# Patient Record
Sex: Female | Born: 1970 | Hispanic: Yes | Marital: Single | Smoking: Current some day smoker
Health system: Southern US, Community
[De-identification: ages and names within clinical notes are randomized; demographics above are authoritative.]

## PROBLEM LIST (undated history)

## (undated) DIAGNOSIS — IMO0002 Reserved for concepts with insufficient information to code with codable children: Secondary | ICD-10-CM

## (undated) DIAGNOSIS — N920 Excessive and frequent menstruation with regular cycle: Secondary | ICD-10-CM

## (undated) HISTORY — PX: SKIN GRAFT: SHX250

## (undated) HISTORY — DX: Reserved for concepts with insufficient information to code with codable children: IMO0002

## (undated) HISTORY — PX: DILATION AND CURETTAGE OF UTERUS: SHX78

## (undated) HISTORY — DX: Excessive and frequent menstruation with regular cycle: N92.0

---

## 2011-07-27 ENCOUNTER — Encounter: Payer: Self-pay | Admitting: Advanced Practice Midwife

## 2011-07-29 ENCOUNTER — Encounter: Payer: Self-pay | Admitting: Obstetrics & Gynecology

## 2011-07-29 ENCOUNTER — Ambulatory Visit (INDEPENDENT_AMBULATORY_CARE_PROVIDER_SITE_OTHER): Payer: Self-pay | Admitting: Obstetrics & Gynecology

## 2011-07-29 VITALS — BP 115/77 | HR 76 | Temp 98.2°F | Ht 63.5 in | Wt 155.3 lb

## 2011-07-29 DIAGNOSIS — N920 Excessive and frequent menstruation with regular cycle: Secondary | ICD-10-CM

## 2011-07-29 NOTE — Patient Instructions (Signed)
IMPORTANTE: CMO USAR ESTA INFORMACIN:  Este es un resumen y NO contiene toda la informacin disponible de este producto. Esta informacin no garantiza que este producto sea seguro, eficaz o apropiado para usted. Esta informacin no es una consulta mdica individualizada y no pretende sustituir la opinin mdica de su profesional sanitario. Siempre consulte a su profesional sanitario para obtener informacin ms completa sobre este producto y sus necesidades sanitarias especficas.    LEVONORGESTREL, IMPLANTE - INTRAUTERINO  USOS:  Este producto es un dispositivo pequeo y flexible que se coloca en la matriz (tero) para prevenir el embarazo. Se usa en mujeres que desean un mtodo anticonceptivo reversible a largo plazo (hasta 5 aos). El dispositivo funciona liberando lentamente una hormona (levonorgestrel) similar a cierta substancia generada por el cuerpo de la mujer.  Este producto slo est dirigido a mujeres que ya han tenido hijos y que slo tienen una pareja sexual. No es para mujeres con antecedentes de ciertas infecciones/afecciones (por ejemplo, enfermedad inflamatoria plvica, enfermedades de transmisin sexual, cierto problema de embarazo conocido como embarazo ectpico). Consulte a su mdico para obtener ms informacin.  El uso de este dispositivo mdico no la protege a usted ni a su compaero sexual contra las enfermedades de transmisin sexual (por ejemplo, VIH, gonorrea).  Lea atentamente toda la informacin que le proporciona su mdico y pregunte cualquier duda que tenga sobre este producto o sobre otros mtodos anticonceptivos que puedan ser adecuados para usted.    MODO DE EMPLEO:  Lea el Folleto de Informacin para el Paciente que su farmacutico le facilita antes de introducirse este dispositivo mdico y cada vez que lo vuelva a insertar. El folleto contiene informacin muy importante acerca de los efectos secundarios y acerca de cundo es importante consultar a su mdico. Si tiene  preguntas, consulte a su mdico o farmacutico.  Un profesional sanitario con la adecuada capacitacin le insertar este producto en el tero, generalmente una vez cada 5 aos o segn lo determine su mdico. El medicamento que se encuentra dentro de este dispositivo se liberar lentamente a su organismo a lo largo de 5 aos.  Haga una cita de seguimiento de 4 a 12 semanas despus de la insercin de este producto para comprobar que sigue correctamente colocado.  Si despus de esos 5 aos todava desea seguir usando este mtodo anticonceptivo, se le extraer el dispositivo viejo y se insertar uno nuevo. El dispositivo mdico puede extraerse en cualquier momento por un profesional de la salud con la capacitacin apropiada.  Aprenda todas las instrucciones sobre cmo y cundo revisar este producto, su posicin correcta en su cuerpo, as como asegurar que entiende los riesgos asociados a su uso. Consulte tambin la seccin de Precauciones.    EFECTOS SECUNDARIOS:  Puede causar hemorragia vaginal irregular (por ejemplo, manchas), calambres, dolor de cabeza, nuseas, dolor de senos, acn, erupciones en la piel, cada de pelo, aumento de peso o prdida del inters sexual. Informe de inmediato a su mdico si cualquiera de estos sntomas persiste o empeora.  Recuerde que su mdico le ha recetado este dispositivo mdico porque ha determinado que el beneficio para usted es mayor que el riesgo de sufrir los efectos secundarios. Mucha gente que lo utiliza no presenta efectos secundarios graves.  Notifique de inmediato a su mdico si presenta cualquiera de los siguientes graves efectos secundarios: ausencia del perodo menstrual, fiebre sin explicacin aparente, escalofros, dificultad para respirar, cambios mentales/anmicos (por ejemplo, depresin, nerviosismo), hinchazn/picazn vaginal, relacin sexual dolorosa.  Notifique de inmediato a su mdico   si presenta cualquiera de los siguientes efectos secundarios poco  probables pero graves: dolor de cabeza intenso (migraa), vmitos, cansancio, ritmo cardaco acelerado/fuerte.  Notifique de inmediato a su mdico si presenta cualquiera de los siguientes efectos secundarios muy poco comunes pero muy graves: sangrado vaginal prolongado o abundante, secreciones/olores vaginales inusuales, llagas vaginales, dolor o sensibilizacin del abdomen/pelvis, bultos en los senos, ojos/piel amarillentos, orina oscura, nuseas persistentes, dificultad para orinar.  Es raro que se produzca una reaccin alrgica muy grave a este medicamento. Sin embargo, busque atencin mdica de urgencia si nota cualquier sntoma de una reaccin alrgica grave, incluyendo: erupciones de la piel, picazn/inflamacin (especialmente en cara/lengua/garganta), mareos intensos, dificultad para respirar.  Esta no es una lista completa de los posibles efectos secundarios. Comunquese con su mdico o farmacutico si nota otros efectos no mencionados anteriormente.  En los [place] - Llame a su mdico para consultarlo acerca de los efectos secundarios. Puede reportar efectos secundarios a la FDA al 1-800-FDA-1088.  En [place] - Llame a su mdico para consultarlo acerca de los efectos secundarios. Puede reportar efectos secundarios a [facility] [place] al [phone].    PRECAUCIONES:  Antes de usar este dispositivo mdico, informe a su mdico o farmacutico si es alrgico al levonorgestrel, a cualquier otro progestgeno (por ejemplo, noretindrona, desogestrel), o si padece de cualquier otra alergia. Este producto podra contener ingredientes inactivos que pueden causar reacciones alrgicas u otros problemas. Consulte a su farmacutico para obtener ms informacin.  Este dispositivo mdico no debe usarse si padece ciertas afecciones mdicas. Antes de usarlo, consulte a su mdico o farmacutico en caso de presentar: embarazo o sospecha de embarazo, embarazo ectpico previo, problemas uterinos (por  ejemplo, cncer, endometriosis, miomas, enfermedad inflamatoria plvica, o PID, por sus siglas en ingls), otro DIU (dispositivo intrauterino) todava puesto, problemas vaginales (por ejemplo, infeccin), cncer de mama, enfermedad/tumores hepticos, cualquier afeccin que afecte su sistema inmune (por ejemplo, SIDA, leucemia).  Antes de usar este producto, informe a su mdico acerca de su historial mdico, especialmente acerca de: trastornos hemorrgicos (por ejemplo, cambios menstruales, problemas de coagulacin sangunea), problemas cardacos (por ejemplo, afecciones vasculares congnitas), presin arterial alta, dolores de cabeza por migraa, derrame cerebral, diabetes.  Si padece de diabetes, este medicamento puede hacer ms difcil el control de sus niveles de azcar en sangre. Tome lectura de su azcar en sangre regularmente, segn las indicaciones de su mdico. Comparta los resultados con su mdico y reporte cualquier sntoma, como aumento de la sed/micciones. Es posible que tenga que modificar su dieta o sus medicamentos antidiabticos.  Este dispositivo mdico puede ocasionalmente salirse o cambiar de lugar. Esto puede resultar en embarazos no deseados u otros problemas. Despus de cada perodo menstrual, asegrese de que sigue en su lugar. Consulte con su mdico para aprender a revisar su dispositivo. Si se sale o no puede encontrar sus hilos, comunquese con su mdico lo antes posible y use un mtodo anticonceptivo de respaldo, como condones.  Si usted o su pareja sexual tiene otras parejas sexuales, este dispositivo mdico podra ya no ser una buena opcin para prevenir el embarazo. Si usted o su pareja se vuelven positivos al VIH, o piensa que pudo haber estado expuesto a cualquier enfermedad de transmisin sexual, comunquese inmediatamente con su mdico. Debe considerar quitarse este dispositivo.  Este dispositivo mdico no debe usarse durante el embarazo. Si queda embarazada o cree que puede  estarlo, informe de inmediato a su mdico. Si acaba de dar a luz y no est amamantando, o si   perdi un embarazo o abort despus del primer trimestre, espere por lo menos 6 semanas antes de volver a usar este dispositivo mdico. Consulte con su mdico acerca de problemas que pueden ocurrir durante el embarazo mientras usa este dispositivo.  El levonorgestrel pasa a la leche materna. Consulte a su mdico antes de amamantar.    INTERACCIONES CON OTROS MEDICAMENTOS:  Es posible que su mdico o farmacutico ya tenga conocimiento de cualquier posible interaccin con otros medicamentos y lo est supervisando para detectarla. No empiece, suspenda ni cambie la dosificacin de ningn medicamento sin antes consultar a su mdico o farmacutico.  Antes de usar este dispositivo mdico, informe a su mdico acerca de todos los productos de venta con y sin receta mdica que est utilizando, especialmente: anticoagulantes (por ejemplo, warfarina), mtodos anticonceptivos tomados por va oral o aplicados en la piel (parche), cierto frmaco usado para el tratamiento de venas varicosas (tetradecil sulfato de sodio), frmacos que afectan su sistema inmune (por ejemplo, corticosteroides como prednisona).  Este documento no menciona todas las posibles interacciones.  Por lo tanto, antes de usar este producto, informe a su mdico o farmacutico de todos los productos que est usando. Lleve consigo una lista de todos sus medicamentos y comprtala con su mdico y su farmacutico.    SOBREDOSIS:  Es muy poco probable que ocurra una sobredosis con este medicamento debido al uso de este dispositivo para administrar el frmaco. Consulte a su mdico o farmacutico para obtener informacin adicional.    NOTAS:  No comparta este medicamento con nadie.  Asista a todas sus citas mdicas y de laboratorio.  Regularmente debe someterse a exmenes fsicos completos que incluyan medicin de la presin arterial, exploracin de las mamas,  exmenes plvicos, prueba de [place]apan[place] (para detectar cncer de cuello uterino). Siga las instrucciones de su mdico para examinarse sus propios senos y reporte inmediatamente la aparicin de cualquier bulto. Consulte a su mdico para obtener ms detalles.    DOSIS OMITIDA:  No aplica.    CONSERVACIN:  Antes de usarlo, mantenga este producto a una temperatura ambiente de 25 grados C (77 grados F), protegido de la luz y la humedad. Por un perodo breve puede guardarlo entre 15 y 30 grados C (59 a 86 grados F). Mantenga todos los medicamentos y los dispositivos mdicos fuera del alcance de los nios y las mascotas.  No deseche medicamentos en el inodoro ni en el desage a menos que se lo indiquen. Deseche apropiadamente este producto cuando se cumpla la fecha de caducidad o cuando ya no lo necesite.  Consulte a su farmacutico o a su compaa local de eliminacin de desechos para obtener mayor informacin sobre cmo desechar de forma segura su producto.    ltima actualizacin junio, 2010 Copyright(c) 2010 [employer]      

## 2011-07-29 NOTE — Progress Notes (Signed)
  Subjective:    Patient ID: Sharon Perry, female    DOB: [birth year], 40 y.o.   MRN: [id number]  HPI [id number] Patient's last menstrual period was 07/22/2011. Pt has heavy periods and uses condoms for [facility]. Considering ablation but counseled at length re need for permanent [facility], risk of  Sterilization and ablation including failure and cost, and anesthesia, She will schedule Mirena insertion, will sign papers for [employer] Past Medical History  Diagnosis Date  . Menorrhagia   . Cyst     not sure which type per pt, was diagnosed with vaginal ultrasound   Past Surgical History  Procedure Date  . Dilation and curettage of uterus    Allergies  Allergen Reactions  . Bactrim Hives  . Sulfa Antibiotics Hives and Other (See Comments)    Causes fever   Current outpatient prescriptions:b complex vitamins tablet, Take 1 tablet by mouth daily.  , Disp: , Rfl: ;  ibuprofen (ADVIL,MOTRIN) 600 MG tablet, Take 600 mg by mouth every 6 (six) hours as needed.  , Disp: , Rfl:  History   Social History  . Marital Status: Single    Spouse Name: N/A    Number of Children: N/A  . Years of Education: N/A   Occupational History  . Not on file.   Social History Main Topics  . Smoking status: Current Some Day Smoker -- 0.1 packs/day  . Smokeless tobacco: Not on file  . Alcohol Use: 1.2 oz/week    1 Cans of beer, 1 Glasses of wine per week  . Drug Use: No  . Sexually Active: Yes    Birth Control/ Protection: Condom   Other Topics Concern  . Not on file   Social History Narrative  . No narrative on file   Married  Review of Systems As in HPI    Objective:   Physical Exam  NAD, pleasant  Pelvic deferred    Assessment & Plan:  Menorrhagia Schedule Mirena

## 2011-10-14 ENCOUNTER — Encounter: Payer: Self-pay | Admitting: Obstetrics and Gynecology

## 2011-10-14 ENCOUNTER — Ambulatory Visit (INDEPENDENT_AMBULATORY_CARE_PROVIDER_SITE_OTHER): Payer: Self-pay | Admitting: Obstetrics and Gynecology

## 2011-10-14 VITALS — BP 128/84 | HR 67 | Temp 97.3°F | Wt 160.5 lb

## 2011-10-14 DIAGNOSIS — Z30430 Encounter for insertion of intrauterine contraceptive device: Secondary | ICD-10-CM

## 2011-10-14 DIAGNOSIS — Z01812 Encounter for preprocedural laboratory examination: Secondary | ICD-10-CM

## 2011-10-14 LAB — POCT PREGNANCY, URINE: Preg Test, Ur: NEGATIVE

## 2011-10-14 MED ORDER — NAPROXEN SODIUM 550 MG PO TABS: 550.0000 mg | ORAL_TABLET | Freq: Two times a day (BID) | ORAL | Status: AC

## 2011-10-14 NOTE — Progress Notes (Signed)
S: Pt presents today for Mirena IUD insertion. She states she is doing well and has no complaints at this time. O: VSS A&Ox3 in NAD NL external genitalia Speculum exam performed. Cervix easily visualized. Cervix cleaned with betadine. Tenaculum then placed on posterior lip of the cervix. Uterus sounded to 8cm. Mirena IUD insert was then placed without difficulty. Pt tolerated this procedure well. A/P: IUD insert: discussed diet, activity, risks, and precautions. Will give Rx for anaprox ds. She will f/u in 2 months. Discussed diet, activity, risks, and precautions.  Clinton Gallant. Rice III, DrHSc, MPAS, PA-C

## 2011-10-19 MED ORDER — IBUPROFEN 200 MG PO TABS
800.0000 mg | ORAL_TABLET | Freq: Once | ORAL | Status: AC
  Administered 2011-10-19: 800 mg via ORAL

## 2011-10-19 NOTE — Progress Notes (Signed)
Addended by: Gerome Apley on: 10/19/2011 04:49 PM   Modules accepted: Orders

## 2011-12-23 ENCOUNTER — Encounter: Payer: Self-pay | Admitting: Family

## 2011-12-23 ENCOUNTER — Ambulatory Visit (INDEPENDENT_AMBULATORY_CARE_PROVIDER_SITE_OTHER): Payer: Self-pay | Admitting: Family

## 2011-12-23 VITALS — BP 128/82 | HR 77 | Temp 97.1°F | Ht 65.0 in | Wt 166.9 lb

## 2011-12-23 DIAGNOSIS — N926 Irregular menstruation, unspecified: Secondary | ICD-10-CM

## 2011-12-23 DIAGNOSIS — N920 Excessive and frequent menstruation with regular cycle: Secondary | ICD-10-CM | POA: Insufficient documentation

## 2011-12-23 LAB — POCT PREGNANCY, URINE: Preg Test, Ur: NEGATIVE

## 2011-12-23 NOTE — Progress Notes (Signed)
  Subjective:    Patient ID: Sharon Perry, female    DOB: 1971-07-01, 41 y.o.   MRN: [id number]  HPI Pt here for IUD check after insertion 2 months ago for menorrhagia.  Reports decrease in bleeding.  No questions or concerns.     Review of Systems    None per pt Objective:   Physical Exam  Constitutional: She is oriented to person, place, and time. She appears well-developed and well-nourished.  HENT:  Head: Normocephalic.  Neck: Normal range of motion. Neck supple.  Abdominal: Soft. There is no tenderness.  Genitourinary: Cervix exhibits no motion tenderness, no discharge and no friability. Vaginal discharge (white) found.       iud strings seen at normal length  Neurological: She is alert and oriented to person, place, and time.  Skin: Skin is warm and dry.          Assessment & Plan:  IUD string check  Plan: Provided reassurance F/U prn [facility]

## 2014-08-25 ENCOUNTER — Encounter: Payer: Self-pay | Admitting: Family

## 2016-06-17 ENCOUNTER — Encounter: Payer: Self-pay | Admitting: *Deleted

## 2016-06-20 ENCOUNTER — Encounter: Payer: Self-pay | Admitting: Family Medicine

## 2016-06-22 ENCOUNTER — Encounter: Payer: Self-pay | Admitting: Family Medicine

## 2016-07-07 ENCOUNTER — Ambulatory Visit: Payer: Self-pay | Admitting: Family Medicine

## 2016-07-15 ENCOUNTER — Ambulatory Visit: Payer: Self-pay | Admitting: Family Medicine

## 2016-07-29 ENCOUNTER — Ambulatory Visit: Payer: Self-pay | Admitting: Family Medicine

## 2016-07-29 VITALS — BP 135/84 | HR 85

## 2016-07-29 DIAGNOSIS — Z975 Presence of (intrauterine) contraceptive device: Secondary | ICD-10-CM

## 2016-07-29 NOTE — Progress Notes (Signed)
Patient not seen by provider

## 2016-07-29 NOTE — Progress Notes (Signed)
Video interpreter [phone] Pt here today for removal of IUD.  Pt informed me that she does not have insurance but that she did pay money to the front office. I advised pt that she could go the [facility] to have IUD removed and also reinserted for possibly free or cost less.  Pt stated that she was informed that she needed to come here to get it removed.  [facility] [place] [facility] and [facility] an [facility]t.  Also gave pt number for the Free Pap Screening and also [facility].  Pt stated thank you.

## 2016-09-07 ENCOUNTER — Other Ambulatory Visit: Payer: Self-pay | Admitting: Obstetrics and Gynecology

## 2016-09-07 DIAGNOSIS — Z1231 Encounter for screening mammogram for malignant neoplasm of breast: Secondary | ICD-10-CM

## 2016-09-27 ENCOUNTER — Ambulatory Visit (HOSPITAL_COMMUNITY)
Admission: RE | Admit: 2016-09-27 | Discharge: 2016-09-27 | Disposition: A | Discharge status: Home / Self Care | Payer: Self-pay | Source: Ambulatory Visit | Attending: Obstetrics and Gynecology | Admitting: Obstetrics and Gynecology

## 2016-09-27 ENCOUNTER — Encounter (HOSPITAL_COMMUNITY): Payer: Self-pay | Admitting: *Deleted

## 2016-09-27 ENCOUNTER — Ambulatory Visit
Admission: RE | Admit: 2016-09-27 | Discharge: 2016-09-27 | Disposition: A | Discharge status: Home / Self Care | Payer: No Typology Code available for payment source | Source: Ambulatory Visit | Attending: Obstetrics and Gynecology | Admitting: Obstetrics and Gynecology

## 2016-09-27 VITALS — BP 118/80 | Temp 98.7°F | Ht 64.5 in | Wt 162.8 lb

## 2016-09-27 DIAGNOSIS — Z1231 Encounter for screening mammogram for malignant neoplasm of breast: Secondary | ICD-10-CM

## 2016-09-27 DIAGNOSIS — Z1239 Encounter for other screening for malignant neoplasm of breast: Secondary | ICD-10-CM

## 2016-09-27 NOTE — Progress Notes (Signed)
No complaints today.   Pap Smear:  Pap smear not completed today. Last Pap smear was 04/12/2016 at [facility] and normal with negative HPV. Per patient has no history of an abnormal Pap smear. Last Pap smear result is scanned into EPIC under media.  Physical exam: Breasts Breasts symmetrical. No skin abnormalities bilateral breasts. No nipple retraction bilateral breasts. No nipple discharge bilateral breasts. No lymphadenopathy. No lumps palpated bilateral breasts. No complaints of pain or tenderness on exam. [facility] mamm[facility]. Appointment scheduled for Tuesday, September 27, 2016 at 1120.        Pelvic/Bimanual No Pap smear completed today since last Pap smear and HPV typing was completed 04/12/2016. Pap smear not indicated per BCCCP guidelines.   Smoking History: Patient has never smoked.  Patient Navigation: Patient education provided. [facility]. Spanish interpreter provided.  Used Spanish interpreter [employer] Gal[employer] from [facility] [employer].

## 2016-09-27 NOTE — Patient Instructions (Addendum)
Explained breast self awareness with Sharon HahnMarjorie Perry. Patient did not need a Pap smear today due to last Pap smear and HPV typing was 12/14/2015. Let her know BCCCP will cover Pap smears and HPV typing every 5 years unless has a history of abnormal Pap smears. [facility]. Appointment scheduled for Tuesday, September 27, 2016 at 1120. Let patient know the [facility] Center will follow up with her within the next couple weeks with results of mammogram by letter or phone. Sharon HahnMarjorie Perry verbalized understanding.  Cooper Stamp, Kathaleen Maserhristine Poll, RN 12:57 PM

## 2016-09-30 ENCOUNTER — Encounter (HOSPITAL_COMMUNITY): Payer: Self-pay | Admitting: *Deleted

## 2021-08-23 ENCOUNTER — Other Ambulatory Visit: Payer: Self-pay | Admitting: Nurse Practitioner

## 2021-08-23 DIAGNOSIS — Z1231 Encounter for screening mammogram for malignant neoplasm of breast: Secondary | ICD-10-CM

## 2021-09-02 ENCOUNTER — Other Ambulatory Visit: Payer: Self-pay

## 2021-09-02 ENCOUNTER — Ambulatory Visit
Admission: RE | Admit: 2021-09-02 | Discharge: 2021-09-02 | Disposition: A | Discharge status: Home / Self Care | Payer: No Typology Code available for payment source | Source: Ambulatory Visit | Attending: Nurse Practitioner | Admitting: Nurse Practitioner

## 2021-09-02 ENCOUNTER — Other Ambulatory Visit: Payer: Self-pay | Admitting: Nurse Practitioner

## 2021-09-02 DIAGNOSIS — Z1231 Encounter for screening mammogram for malignant neoplasm of breast: Secondary | ICD-10-CM

## 2022-11-29 IMAGING — MG MM DIGITAL SCREENING BILAT W/ TOMO AND CAD
8 series · 9 of 24 positions shown · non-contrast
Comparison: Previous exam(s).

CLINICAL DATA: Screening.

EXAM:
DIGITAL SCREENING BILATERAL MAMMOGRAM WITH TOMOSYNTHESIS AND CAD
TECHNIQUE: Bilateral screening digital craniocaudal and mediolateral oblique
mammograms were obtained. Bilateral screening digital breast
tomosynthesis was performed. The images were evaluated with
computer-aided detection.

[R MLO synth-2D]
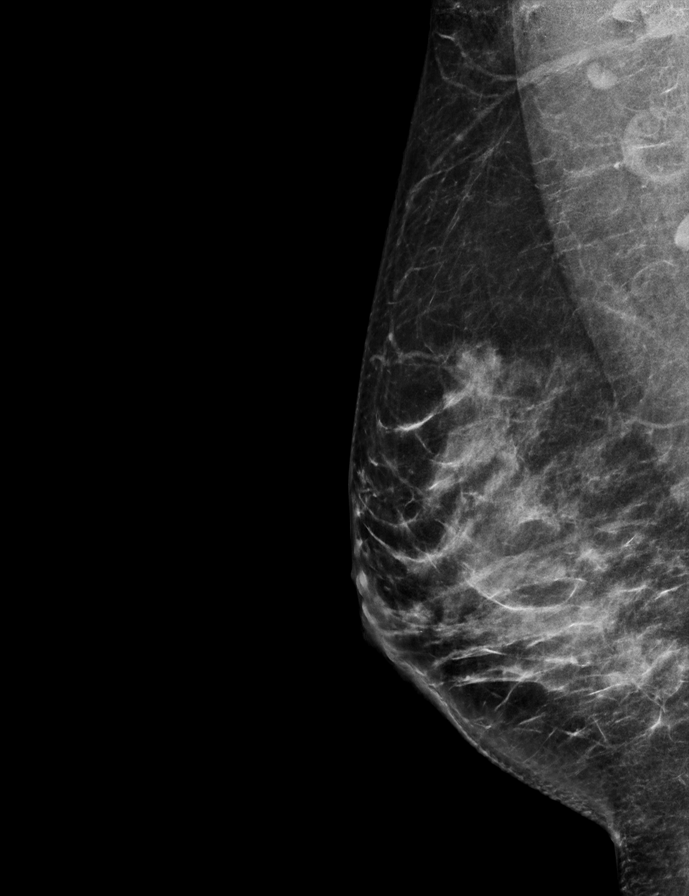

[R CC synth-2D]
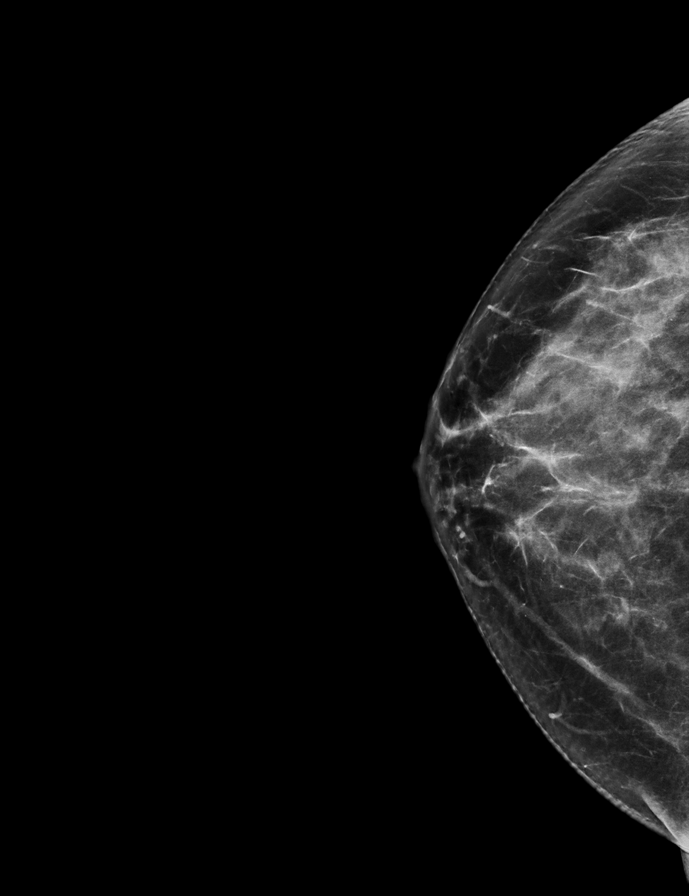

[L MLO synth-2D]
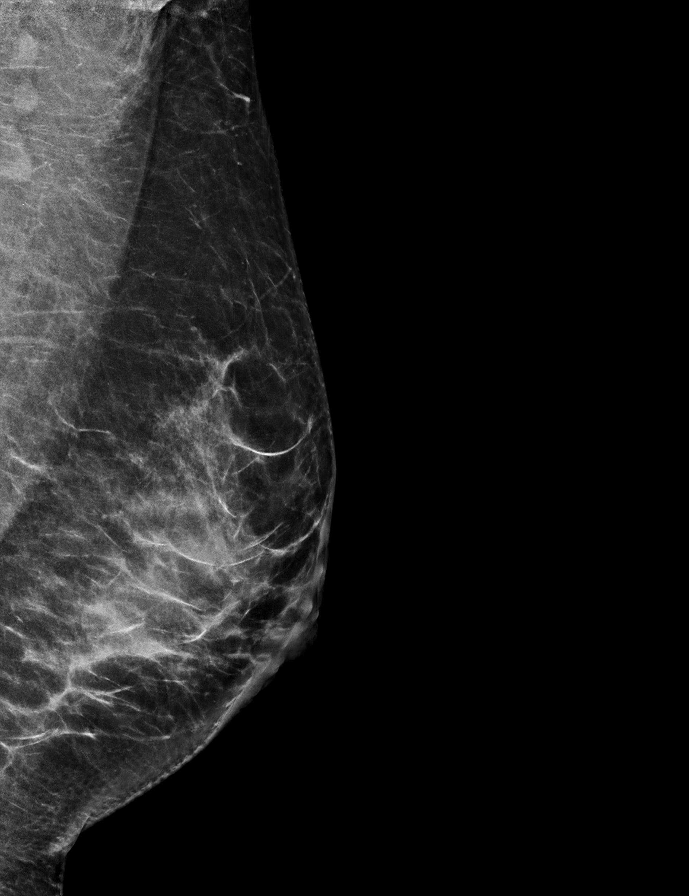

[L CC synth-2D]
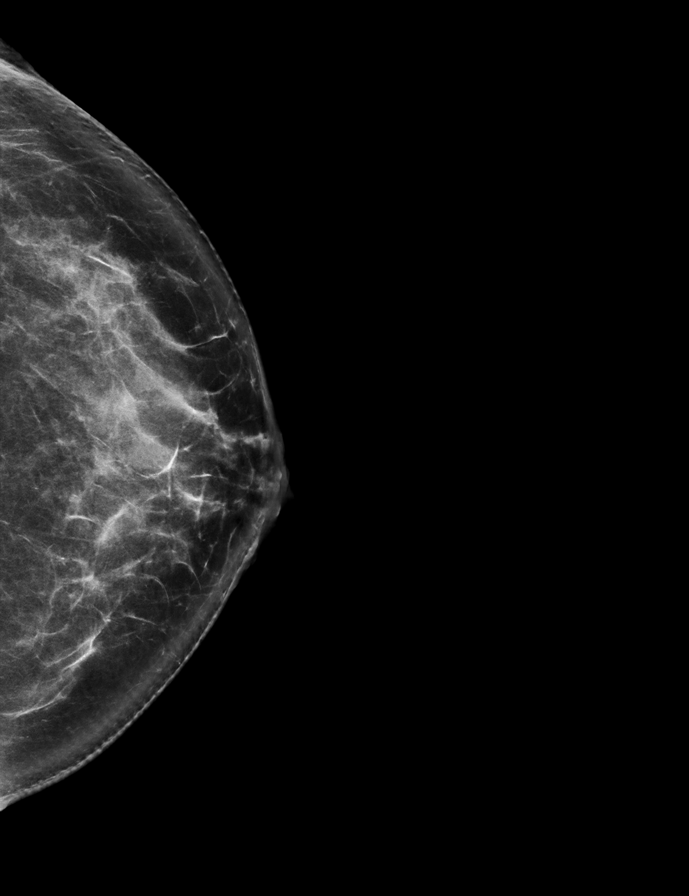

[L CC tomo · 2 of 80 frames shown]
[frame 26/80]
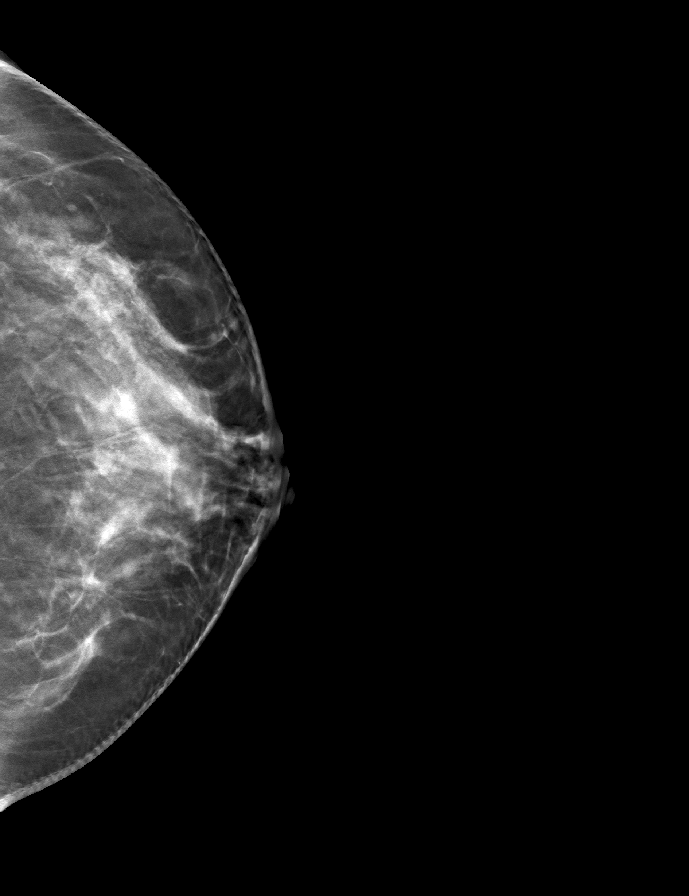
[frame 41/80]
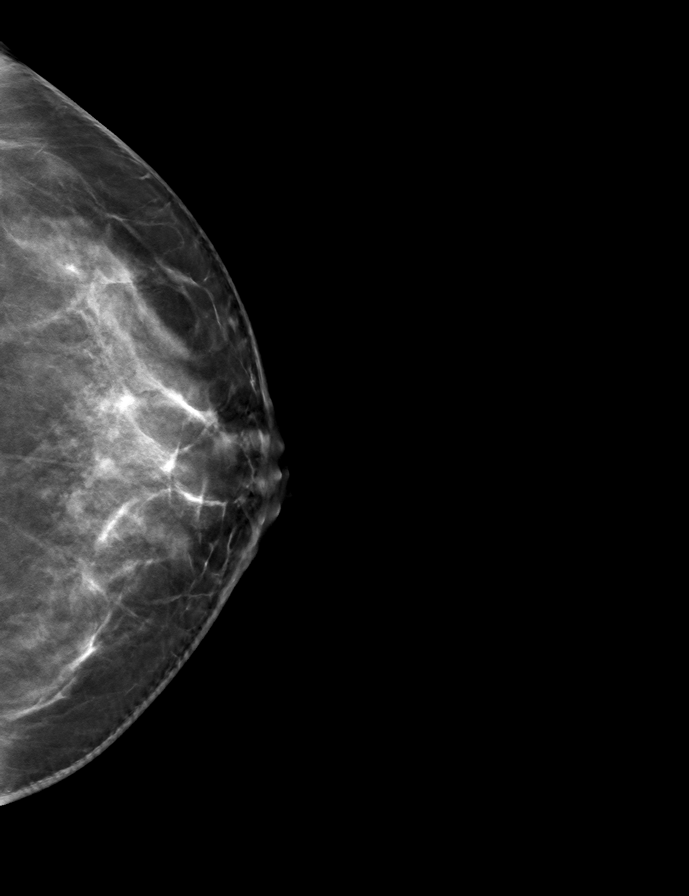

[L MLO tomo · tomo slice 36/71.0]
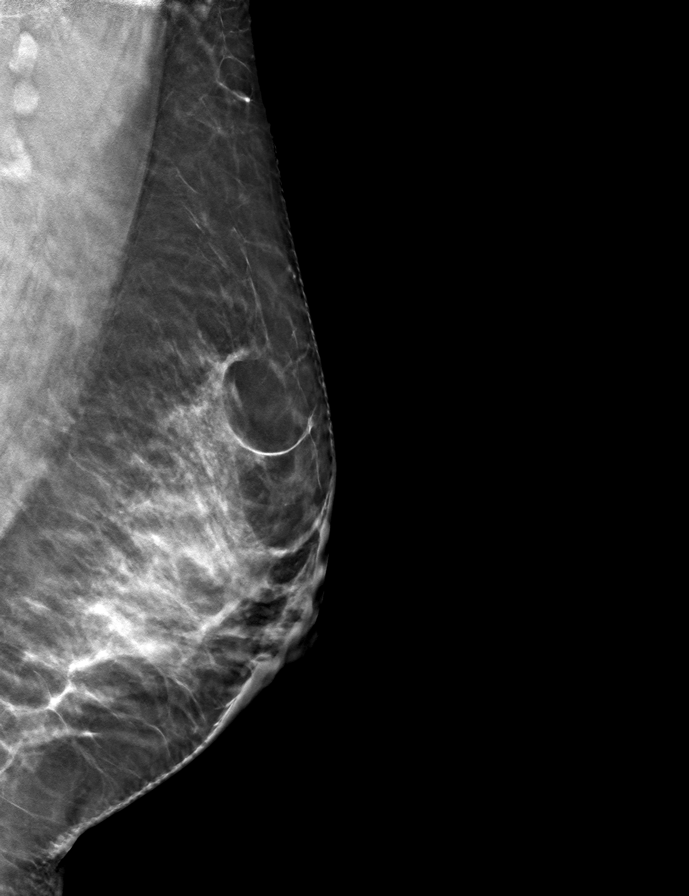

[R CC tomo · tomo slice 35/69.0]
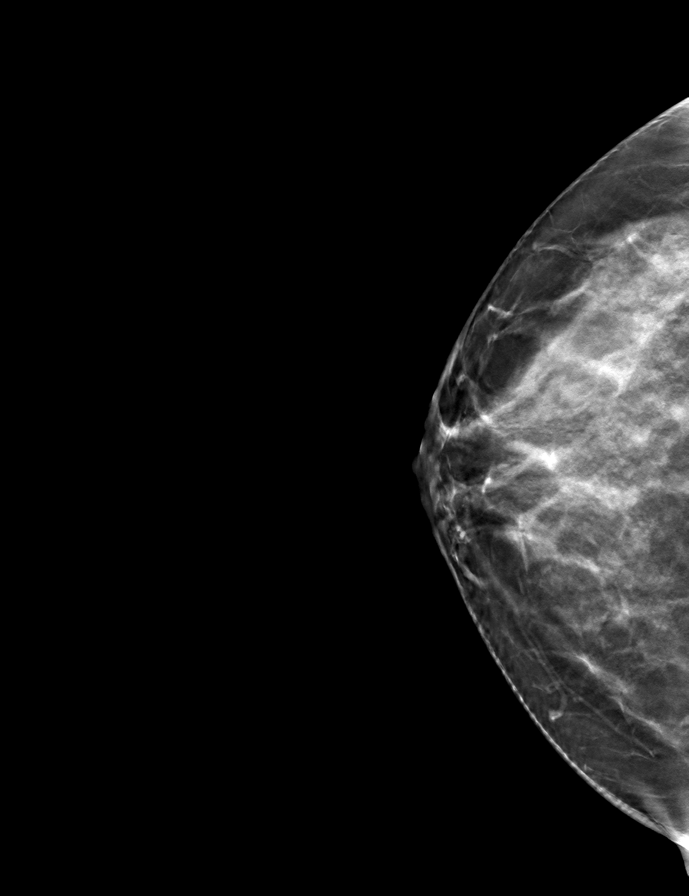

[R MLO tomo · tomo slice 37/72.0]
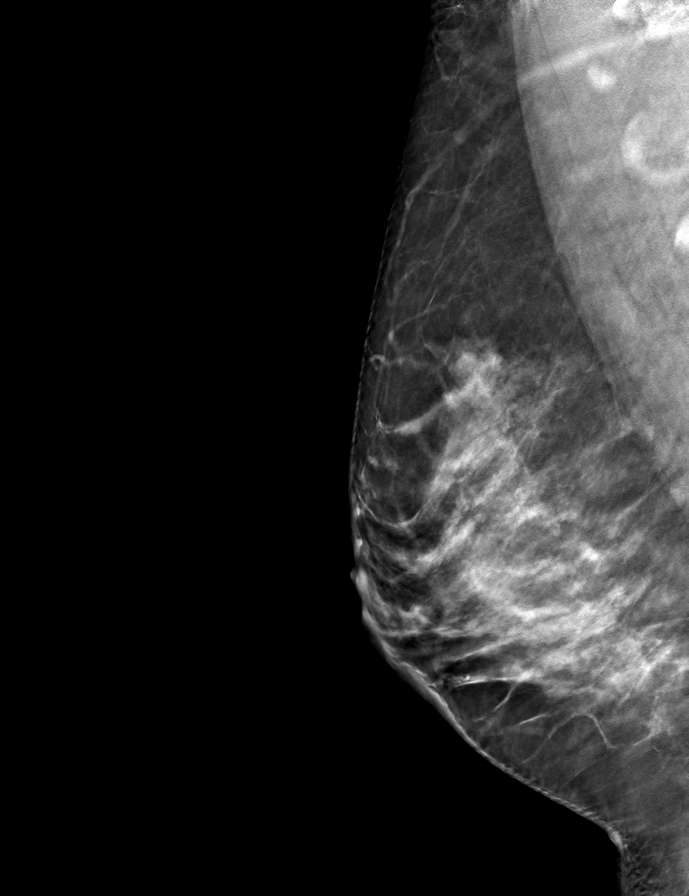

[9 of 24 positions shown; findings below may reference images not displayed]

ACR Breast Density Category c: The breast tissue is heterogeneously
dense, which may obscure small masses.
FINDINGS: There are no findings suspicious for malignancy.
IMPRESSION: No mammographic evidence of malignancy. A result letter of this
screening mammogram will be mailed directly to the patient.

RECOMMENDATION:
Screening mammogram in one year. (Code:Q3-W-BC3)

BI-RADS CATEGORY  1: Negative.
